# Patient Record
Sex: Male | Born: 2000
Health system: Southern US, Community
[De-identification: ages and names within clinical notes are randomized; demographics above are authoritative.]

---
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
## 2023-03-10 DIAGNOSIS — M4000 Postural kyphosis, site unspecified: Secondary | ICD-10-CM | POA: Diagnosis not present

## 2023-03-10 DIAGNOSIS — Z0001 Encounter for general adult medical examination with abnormal findings: Secondary | ICD-10-CM | POA: Diagnosis not present

## 2023-03-10 DIAGNOSIS — Z23 Encounter for immunization: Secondary | ICD-10-CM | POA: Diagnosis not present

## 2023-03-10 DIAGNOSIS — F40248 Other situational type phobia: Secondary | ICD-10-CM | POA: Diagnosis not present

## 2023-03-10 DIAGNOSIS — Z111 Encounter for screening for respiratory tuberculosis: Secondary | ICD-10-CM | POA: Diagnosis not present

## 2023-03-10 DIAGNOSIS — M4180 Other forms of scoliosis, site unspecified: Secondary | ICD-10-CM | POA: Diagnosis not present

## 2023-03-12 DIAGNOSIS — Z1329 Encounter for screening for other suspected endocrine disorder: Secondary | ICD-10-CM | POA: Diagnosis not present

## 2023-03-12 DIAGNOSIS — E559 Vitamin D deficiency, unspecified: Secondary | ICD-10-CM | POA: Diagnosis not present

## 2023-03-12 DIAGNOSIS — Z0001 Encounter for general adult medical examination with abnormal findings: Secondary | ICD-10-CM | POA: Diagnosis not present

## 2023-03-19 ENCOUNTER — Other Ambulatory Visit (HOSPITAL_BASED_OUTPATIENT_CLINIC_OR_DEPARTMENT_OTHER): Payer: Self-pay

## 2023-03-19 MED ORDER — VITAMIN D (ERGOCALCIFEROL) 50000 UNITS PO CAPS
ORAL_CAPSULE | ORAL | 1 refills | Status: AC
  Filled 2023-03-19: qty 12, 84d supply, fill #0

## 2023-03-30 ENCOUNTER — Other Ambulatory Visit (HOSPITAL_BASED_OUTPATIENT_CLINIC_OR_DEPARTMENT_OTHER): Payer: Self-pay

## 2023-09-03 DIAGNOSIS — L723 Sebaceous cyst: Secondary | ICD-10-CM | POA: Diagnosis not present

## 2023-09-03 DIAGNOSIS — L089 Local infection of the skin and subcutaneous tissue, unspecified: Secondary | ICD-10-CM | POA: Diagnosis not present

## 2023-09-21 ENCOUNTER — Other Ambulatory Visit (HOSPITAL_BASED_OUTPATIENT_CLINIC_OR_DEPARTMENT_OTHER): Payer: Self-pay

## 2023-09-21 MED ORDER — INFLUENZA VIRUS VACC SPLIT PF (FLUZONE) 0.5 ML IM SUSY
0.5000 mL | PREFILLED_SYRINGE | Freq: Once | INTRAMUSCULAR | 0 refills | Status: AC
  Filled 2023-09-21: qty 0.5, 1d supply, fill #0

## 2023-09-22 ENCOUNTER — Other Ambulatory Visit (HOSPITAL_BASED_OUTPATIENT_CLINIC_OR_DEPARTMENT_OTHER): Payer: Self-pay

## 2024-02-02 DIAGNOSIS — Z111 Encounter for screening for respiratory tuberculosis: Secondary | ICD-10-CM | POA: Diagnosis not present

## 2024-02-12 ENCOUNTER — Other Ambulatory Visit (HOSPITAL_BASED_OUTPATIENT_CLINIC_OR_DEPARTMENT_OTHER): Payer: Self-pay

## 2024-02-20 DIAGNOSIS — K120 Recurrent oral aphthae: Secondary | ICD-10-CM | POA: Diagnosis not present

## 2024-06-21 DIAGNOSIS — Z1330 Encounter for screening examination for mental health and behavioral disorders, unspecified: Secondary | ICD-10-CM | POA: Diagnosis not present

## 2024-06-21 DIAGNOSIS — J069 Acute upper respiratory infection, unspecified: Secondary | ICD-10-CM | POA: Diagnosis not present

## 2024-06-25 ENCOUNTER — Other Ambulatory Visit (HOSPITAL_BASED_OUTPATIENT_CLINIC_OR_DEPARTMENT_OTHER): Payer: Self-pay

## 2024-06-25 MED ORDER — AZITHROMYCIN 250 MG PO TABS
ORAL_TABLET | ORAL | 0 refills | Status: AC
  Filled 2024-06-25: qty 6, 5d supply, fill #0

## 2024-10-18 ENCOUNTER — Other Ambulatory Visit (HOSPITAL_BASED_OUTPATIENT_CLINIC_OR_DEPARTMENT_OTHER): Payer: Self-pay

## 2024-10-18 DIAGNOSIS — L02429 Furuncle of limb, unspecified: Secondary | ICD-10-CM | POA: Diagnosis not present

## 2024-10-18 MED ORDER — DOXYCYCLINE HYCLATE 100 MG PO CAPS
ORAL_CAPSULE | ORAL | 0 refills | Status: AC
  Filled 2024-10-18: qty 14, 7d supply, fill #0

## 2024-10-19 ENCOUNTER — Other Ambulatory Visit (HOSPITAL_BASED_OUTPATIENT_CLINIC_OR_DEPARTMENT_OTHER): Payer: Self-pay
